# Patient Record
Sex: Female | Born: 2000 | Hispanic: No | Marital: Single | Smoking: Never smoker
Health system: Southern US, Community
[De-identification: ages and names within clinical notes are randomized; demographics above are authoritative.]

## PROBLEM LIST (undated history)

## (undated) DIAGNOSIS — J302 Other seasonal allergic rhinitis: Secondary | ICD-10-CM

## (undated) DIAGNOSIS — T7840XA Allergy, unspecified, initial encounter: Secondary | ICD-10-CM

## (undated) HISTORY — DX: Allergy, unspecified, initial encounter: T78.40XA

---
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
## 2021-06-06 ENCOUNTER — Encounter: Payer: Self-pay | Admitting: Cardiovascular Disease

## 2021-06-06 ENCOUNTER — Other Ambulatory Visit: Payer: Self-pay

## 2021-06-06 ENCOUNTER — Ambulatory Visit (INDEPENDENT_AMBULATORY_CARE_PROVIDER_SITE_OTHER): Payer: Medicaid Other | Admitting: Cardiovascular Disease

## 2021-06-06 VITALS — BP 114/84 | HR 73 | Ht 60.0 in | Wt 177.4 lb

## 2021-06-06 DIAGNOSIS — I498 Other specified cardiac arrhythmias: Secondary | ICD-10-CM

## 2021-06-06 DIAGNOSIS — E669 Obesity, unspecified: Secondary | ICD-10-CM

## 2021-06-06 NOTE — Progress Notes (Signed)
Cardiology Office Note:   Date:  06/06/2021  NAME:  Sherri Vaughan    MRN: [id number] DOB:  09-07-2001   PCP:  Dot Been, FNP  Cardiologist:  None  Electrophysiologist:  None   Referring MD: Dot Been, FNP   Chief Complaint  Patient presents with   Irregular Heart Beat   History of Present Illness:   Sherri Vaughan is a 20 y.o. female with a hx of vasovagal symptoms who is being seen today for the evaluation of irregular heartbeat at the request of Dot Been, FNP.  She reports has been seen by primary care doctor twice.  Noted to have an irregular heartbeat.  She has been sent for EKGs and they have been normal.  She was seen by her primary care physician recently and did have an irregular heartbeat.  EKG again was normal.  She was sent for evaluation here.  Her EKG demonstrates normal sinus rhythm with sinus arrhythmia.  I suspect this is the etiology for irregularity of her heart rhythm.  She really has no medical problems.  In the past she was seen by a pediatric cardiologist for vasovagal symptoms.  She did not have syncope.  Hydration was recommended.  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  I suspect this is artifact as mentioned in the note by the pediatric cardiologist.  She reports no symptoms.  Denies any chest pain or trouble breathing.  Does not exercise routinely.  BMI 34.  She reports she currently works at the [employer].  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  She reports between school and work she has little time to exercise.  She denies any chest pain or trouble breathing.  No palpitations reported.  No limitations.  Lab work from PCP demonstrates low vitamin D.  This has been replaced.  Her EKG in office demonstrates sinus rhythm heart rate 73 with sinus arrhythmia.  Cardiovascular examination is normal.  She reports she vapes.  I recommended against this.  She does not drink alcohol or use drugs.  No family history of  heart disease.  No symptoms reported today.  Problem List Vasovagal symptoms -dx by peds cards 12/03/2016 @ [facility]  Past Medical History: Past Medical History:  Diagnosis Date   Seasonal allergies     Past Surgical History: History reviewed. No pertinent surgical history.  Current Medications: No outpatient medications have been marked as taking for the 06/06/21 encounter (Office Visit) with Sande Rives, MD.     Allergies:    Patient has no known allergies.   Social History: Social History   Socioeconomic History   Marital status: Single    Spouse name: Not on file   Number of children: Not on file   Years of education: Not on file   Highest education level: Not on file  Occupational History   Occupation: [employer]/Student  Tobacco Use   Smoking status: Never    Passive exposure: Never   Smokeless tobacco: Never  Vaping Use   Vaping Use: Never used  Substance and Sexual Activity   Alcohol use: No   Drug use: No   Sexual activity: Yes    Birth control/protection: None  Other Topics Concern   Not on file  Social History Narrative   Not on file   Social Determinants of Health   Financial Resource Strain: Not on file  Food Insecurity: Not on file  Transportation Needs: Not on file  Physical Activity: Not on file  Stress: Not on file  Social Connections: Not on file     Family History: The patient's family history includes Alzheimer's disease in her maternal grandmother; Diabetes in her paternal grandmother; Hypertension in her father; Kidney Stones in her mother.  ROS:   All other ROS reviewed and negative. Pertinent positives noted in the HPI.     EKGs/Labs/Other Studies Reviewed:   The following studies were personally reviewed by me today:  EKG:  EKG is ordered today.  The ekg ordered today demonstrates normal sinus rhythm heart rate 73, sinus arrhythmia which is normal, and was personally reviewed by me.   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    Otherwise normal cardiac anatomy and function.   Recent Labs: No results found for requested labs within last 8760 hours.   Recent Lipid Panel No results found for: CHOL, TRIG, HDL, CHOLHDL, VLDL, LDLCALC, LDLDIRECT  Physical Exam:   VS:  BP 114/84 (BP Location: Left Arm, Patient Position: Sitting, Cuff Size: Small)   Pulse 73   Ht 5' (1.524 m)   Wt 177 lb 6.4 oz (80.5 kg)   SpO2 98%   BMI 34.65 kg/m    Wt Readings from Last 3 Encounters:  06/06/21 177 lb 6.4 oz (80.5 kg) (94 %, Z= 1.56)*  09/26/20 162 lb 3.2 oz (73.6 kg) (90 %, Z= 1.27)*  09/19/20 164 lb (74.4 kg) (91 %, Z= 1.31)*   * Growth percentiles are based on CDC (Girls, 2-20 Years) data.    General: Well nourished, well developed, in no acute distress Head: Atraumatic, normal size  Eyes: PEERLA, EOMI  Neck: Supple, no JVD Endocrine: No thryomegaly Cardiac: Normal S1, S2; RRR; no murmurs, rubs, or gallops Lungs: Clear to auscultation bilaterally, no wheezing, rhonchi or rales  Abd: Soft, nontender, no hepatomegaly  Ext: No edema, pulses 2+ Musculoskeletal: No deformities, BUE and BLE strength normal and equal Skin: Warm and dry, no rashes   Neuro: Alert and oriented to person, place, time, and situation, CNII-XII grossly intact, no focal deficits  Psych: Normal mood and affect   ASSESSMENT:   Sherri Vaughan is a 20 y.o. female who presents for the following: 1. Sinus arrhythmia   2. Obesity (BMI 30-39.9)     PLAN:   1. Sinus arrhythmia -Seen by primary care physician with irregular heartbeat.  EKG there was normal sinus rhythm.  EKG today demonstrates sinus rhythm heart rate 73 with sinus arrhythmia.  She has no symptoms.  No chest pain or trouble breathing.  An echocardiogram was performed as part of pediatric cardiology work-up in 2018.  This was normal.  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   This is a benign entity.  There is mention that this could have been artifact as well.  She has no need for antibiotics or SBE prophylaxis.  I suspect this was all artifact.  She has no murmur on exam.  Even if she did have this this is not an issue.  Her irregular heartbeat is likely just normal related to sinus arrhythmia.  This is related to inspiration and expiration.  This is not an issue.  Given her lack of symptoms I see no need for further work-up.  She needs yearly evaluation by PCP including thyroid studies.  We will defer this to her primary care physician.  2. Obesity (BMI 30-39.9) -Exercise and weight loss recommended.  Disposition: Return if symptoms worsen or fail to improve.  Medication Adjustments/Labs and Tests Ordered: Current medicines are reviewed at length with the patient today.  Concerns regarding medicines are outlined above.  Orders Placed This Encounter  Procedures   EKG 12-Lead    No orders of the defined types were placed in this encounter.   Patient Instructions  Medication Instructions:  The current medical regimen is effective;  continue present plan and medications.  *If you need a refill on your cardiac medications before your next appointment, please call your pharmacy*   Follow-Up: At [facility], you and your health needs are our priority.  As part of our continuing mission to provide you with exceptional heart care, we have created designated Provider Care Teams.  These Care Teams include your primary Cardiologist (physician) and Advanced Practice Providers (APPs -  Physician Assistants and Nurse Practitioners) who all work together to provide you with the care you need, when you need it.  We recommend signing up for the patient portal called "MyChart".  Sign up information is provided on this After Visit Summary.  MyChart is used to connect with patients for Virtual Visits (Telemedicine).  Patients are able to view lab/test results, encounter  notes, upcoming appointments, etc.  Non-urgent messages can be sent to your provider as well.   To learn more about what you can do with MyChart, go to [email or web address].    Your next appointment:   As needed  The format for your next appointment:   In Person  Provider:   Lennie Odor, MD        Signed, Lenna Gilford. Flora Lipps, MD, [facility]  [facility]  [address] [place], [place] [address] [phone]  06/06/2021 3:27 PM

## 2021-06-06 NOTE — Patient Instructions (Signed)
Medication Instructions:  The current medical regimen is effective;  continue present plan and medications.  *If you need a refill on your cardiac medications before your next appointment, please call your pharmacy*    Follow-Up: At [facility], you and your health needs are our priority.  As part of our continuing mission to provide you with exceptional heart care, we have created designated Provider Care Teams.  These Care Teams include your primary Cardiologist (physician) and Advanced Practice Providers (APPs -  Physician Assistants and Nurse Practitioners) who all work together to provide you with the care you need, when you need it.  We recommend signing up for the patient portal called "MyChart".  Sign up information is provided on this After Visit Summary.  MyChart is used to connect with patients for Virtual Visits (Telemedicine).  Patients are able to view lab/test results, encounter notes, upcoming appointments, etc.  Non-urgent messages can be sent to your provider as well.   To learn more about what you can do with MyChart, go to [email or web address].    Your next appointment:   As needed  The format for your next appointment:   In Person  Provider:   [place] O'Neal, MD      

## 2022-01-14 ENCOUNTER — Ambulatory Visit: Payer: Self-pay | Admitting: *Deleted

## 2022-01-14 NOTE — Telephone Encounter (Signed)
Summary: Allergies, symptoms  ? Pt is requesting allergy medication because she says she is experiencing congestion/sneezing/headaches/runny nose.  ? ?Not established yet, but seeking assistance now  ? ?Best contact: [phone]   ?  ? ? ? ?Chief Complaint: requesting medication  ?Symptoms: nasal congestion, cough, headache, sneezing, runny nose.. Yellow mucus Reports yearly allergies. Has not tested for covid.  ?Frequency: 2-3 weeks  ?Pertinent Negatives: Patient denies difficulty breathing , no fever, no covid ?Disposition: [] ED /[] Urgent Care (no appt availability in office) / [] Appointment(In office/virtual)/ []  [facility]/ [] Home Care/ [] Refused Recommended Disposition /[x] [facility]/PCE with , PA []  Follow-up with PCP ?Additional Notes:  ? ?Appt scheduled with PCE walk in with , PA   ? ? ? ?Reason for Disposition ? [1] Sinus congestion (pressure, fullness) AND [2] present > 10 days ? ?Answer Assessment - Initial Assessment Questions ?1. LOCATION: "Where does it hurt?"  ?    Sinuses forehead  ?2. ONSET: "When did the sinus pain start?"  (e.g., hours, days)  ?    2-3 weeks ago ?3. SEVERITY: "How bad is the pain?"   (Scale 1-10; mild, moderate or severe) ?  - MILD (1-3): doesn't interfere with normal activities  ?  - MODERATE (4-7): interferes with normal activities (e.g., work or school) or awakens from sleep ?  - SEVERE (8-10): excruciating pain and patient unable to do any normal activities    ?    Getting worse  ?4. RECURRENT SYMPTOM: "Have you ever had sinus problems before?" If Yes, ask: "When was the last time?" and "What happened that time?"  ?    Yes every year  ?5. NASAL CONGESTION: "Is the nose blocked?" If Yes, ask: "Can you open it or must you breathe through your mouth?" ?    Can breathe nasal congestion noted  ?6. NASAL DISCHARGE: "Do you have discharge from your nose?" If so ask, "What color?" ?    Yes clear  ?7. FEVER: "Do you have a fever?" If  Yes, ask: "What is it, how was it measured, and when did it start?"  ?    no ?8. OTHER SYMPTOMS: "Do you have any other symptoms?" (e.g., sore throat, cough, earache, difficulty breathing) ?    Sneezing, headache , runny nose, nasal and cough congestion ?9. PREGNANCY: "Is there any chance you are pregnant?" "When was your last menstrual period?" ?    na ? ?Protocols used: Sinus Pain or Congestion-A-AH ? ?

## 2022-01-15 ENCOUNTER — Encounter: Payer: Self-pay | Admitting: Physician Assistant

## 2022-01-15 ENCOUNTER — Telehealth (INDEPENDENT_AMBULATORY_CARE_PROVIDER_SITE_OTHER): Payer: Medicaid Other | Admitting: Physician Assistant

## 2022-01-15 DIAGNOSIS — J302 Other seasonal allergic rhinitis: Secondary | ICD-10-CM

## 2022-01-15 MED ORDER — CETIRIZINE HCL 10 MG PO TABS: 10.0000 mg | ORAL_TABLET | Freq: Every day | ORAL | 11 refills | Status: DC

## 2022-01-15 MED ORDER — FLUTICASONE PROPIONATE 50 MCG/ACT NA SUSP: 2.0000 | Freq: Every day | NASAL | 6 refills | Status: DC

## 2022-01-15 NOTE — Progress Notes (Signed)
?Virtual Visit Consent  ? ?Sherri Vaughan, you are scheduled for a virtual visit with a [facility] provider today.   ?  ?Just as with appointments in the office, your consent must be obtained to participate.  Your consent will be active for this visit and any virtual visit you may have with one of our providers in the next 365 days.   ?  ?If you have a MyChart account, a copy of this consent can be sent to you electronically.  All virtual visits are billed to your insurance company just like a traditional visit in the office.   ? ?As this is a virtual visit, video technology does not allow for your provider to perform a traditional examination.  This may limit your provider's ability to fully assess your condition.  If your provider identifies any concerns that need to be evaluated in person or the need to arrange testing (such as labs, EKG, etc.), we will make arrangements to do so.   ?  ?Although advances in technology are sophisticated, we cannot ensure that it will always work on either your end or our end.  If the connection with a video visit is poor, the visit may have to be switched to a telephone visit.  With either a video or telephone visit, we are not always able to ensure that we have a secure connection.    ? ?I need to obtain your verbal consent now.   Are you willing to proceed with your visit today?  ?  ?Alvetta Hidrogo has provided verbal consent on 01/15/2022 for a virtual visit (video or telephone). ?  ?Roney Jaffe, PA-C  ? ?Date: 01/15/2022 1:21 PM ? ? ?Virtual Visit via Video Note  ? ?I, Kasandra Knudsen Mayers, connected with  Rafaela Dinius  ([id number], Mar 27, 2001) on 01/15/22 at  1:40 PM EDT by a video-enabled telemedicine application and verified that I am speaking with the correct person using two identifiers. ? ?Location: ?Patient: Virtual Visit Location Patient: Home ?Provider: Virtual Visit Location Provider: Office/Clinic ?  ?I discussed the limitations of evaluation  and management by telemedicine and the availability of in person appointments. The patient expressed understanding and agreed to proceed.   ? ?History of Present Illness: ?Jakeline Dave is a 21 y.o. who identifies as a female who was assigned female at birth, states that she has been experiencing nasal congestion with yellow mucus, productive cough with yellow sputum, headache, sneezing and runny nose for the past 2 to 3 weeks. ? ?No sick contacts, Did not take COVID test.  Does have a histroy of seasonal allergies ? ? States that she purchased some allergy relief OTC but it makes her drowsy. ? ?Review of Systems  ?Constitutional:  Negative for chills and fever.  ?HENT:  Positive for congestion. Negative for ear pain, sinus pain and sore throat.   ?Eyes: Negative.   ?Respiratory:  Positive for cough. Negative for shortness of breath and wheezing.   ?Cardiovascular:  Negative for chest pain.  ?Gastrointestinal:  Negative for diarrhea, nausea and vomiting.  ?Genitourinary: Negative.   ?Musculoskeletal:  Negative for myalgias.  ?Skin: Negative.   ?Neurological:  Positive for headaches.  ?Endo/Heme/Allergies: Negative.   ?Psychiatric/Behavioral: Negative.    ? ? ? ?HPI: HPI  ?Problems:  ?Patient Active Problem List  ? Diagnosis Date Noted  ? Seasonal allergies 01/15/2022  ? 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  Vasovagal syncope 12/03/2016  ?  ?Allergies: No Known Allergies ?Medications:  ?Current Outpatient Medications:  ?  cetirizine (ZYRTEC) 10 MG tablet, Take 1 tablet (10 mg total) by mouth daily., Disp: 30 tablet, Rfl: 11 ?  fluticasone (FLONASE) 50 MCG/ACT nasal spray, Place 2 sprays into both nostrils daily., Disp: 16 g, Rfl: 6 ?  gabapentin (NEURONTIN) 100 MG capsule, Take 1 capsule (100 mg total) by mouth 3 (three) times daily., Disp: 90 capsule, Rfl: 1 ?  meloxicam (MOBIC) 15 MG tablet, Take 0.5-1 tablets (7.5-15  mg total) by mouth daily as needed for pain., Disp: 30 tablet, Rfl: 6 ?  norethindrone-ethinyl estradiol-iron (LOESTRIN FE) 1.5-30 MG-MCG tablet, Take 1 tablet by mouth daily., Disp: 1 Package, Rfl: 11 ?  norgestimate-ethinyl estradiol (SPRINTEC 28) 0.25-35 MG-MCG tablet, Take 1 tablet by mouth daily. (Patient not taking: Reported on 06/06/2021), Disp: 28 tablet, Rfl: 11 ? ?Observations/Objective: ?Patient is well-developed, well-nourished in no acute distress.  ?Resting comfortably  at home.  ?Head is normocephalic, atraumatic.  ?No labored breathing.  ?Speech is clear and coherent with logical content.  ?Patient is alert and oriented at baseline.  ? ? ?Assessment and Plan: ?1. Seasonal allergies ?- cetirizine (ZYRTEC) 10 MG tablet; Take 1 tablet (10 mg total) by mouth daily.  Dispense: 30 tablet; Refill: 11 ?- fluticasone (FLONASE) 50 MCG/ACT nasal spray; Place 2 sprays into both nostrils daily.  Dispense: 16 g; Refill: 6 ? ?1. Seasonal allergies ?Trial Zyrtec, Flonase.  Patient education given on supportive care.  [facility] ?- cetirizine (ZYRTEC) 10 MG tablet; Take 1 tablet (10 mg total) by mouth daily.  Dispense: 30 tablet; Refill: 11 ?- fluticasone (FLONASE) 50 MCG/ACT nasal spray; Place 2 sprays into both nostrils daily.  Dispense: 16 g; Refill: 6 ? ? ?Follow Up Instructions: ?I discussed the assessment and treatment plan with the patient. The patient was provided an opportunity to ask questions and all were answered. The patient agreed with the plan and demonstrated an understanding of the instructions.  A copy of instructions were sent to the patient via MyChart unless otherwise noted below.  ? ? ? ?The patient was advised to call back or seek an in-person evaluation if the symptoms worsen or if the condition fails to improve as anticipated. ? ?Time:  ?I spent 12 minutes with the patient via telehealth technology discussing the  above problems/concerns.   ? ?Abigale Dorow S Mayers, PA-C ? ?

## 2022-01-15 NOTE — Patient Instructions (Signed)
Sherri Vaughan, thank you for joining Roney Jaffeari S Mayers, PA-C for today's virtual visit.  While this provider is not your primary care provider (PCP), if your PCP is located in our provider database this encounter information will be shared with them immediately following your visit. ? ?Consent: ?(Patient) Sherri Vaughan provided verbal consent for this virtual visit at the beginning of the encounter. ? ?Current Medications: ? ?Current Outpatient Medications:  ?  cetirizine (ZYRTEC) 10 MG tablet, Take 1 tablet (10 mg total) by mouth daily., Disp: 30 tablet, Rfl: 11 ?  fluticasone (FLONASE) 50 MCG/ACT nasal spray, Place 2 sprays into both nostrils daily., Disp: 16 g, Rfl: 6 ?  gabapentin (NEURONTIN) 100 MG capsule, Take 1 capsule (100 mg total) by mouth 3 (three) times daily., Disp: 90 capsule, Rfl: 1 ?  meloxicam (MOBIC) 15 MG tablet, Take 0.5-1 tablets (7.5-15 mg total) by mouth daily as needed for pain., Disp: 30 tablet, Rfl: 6 ?  norethindrone-ethinyl estradiol-iron (LOESTRIN FE) 1.5-30 MG-MCG tablet, Take 1 tablet by mouth daily., Disp: 1 Package, Rfl: 11 ?  norgestimate-ethinyl estradiol (SPRINTEC 28) 0.25-35 MG-MCG tablet, Take 1 tablet by mouth daily. (Patient not taking: Reported on 06/06/2021), Disp: 28 tablet, Rfl: 11  ? ?Medications ordered in this encounter:  ?Meds ordered this encounter  ?Medications  ? cetirizine (ZYRTEC) 10 MG tablet  ?  Sig: Take 1 tablet (10 mg total) by mouth daily.  ?  Dispense:  30 tablet  ?  Refill:  11  ?  Order Specific Question:   Supervising Provider  ?  Answer:   Storm FriskWRIGHT, PATRICK E [[id number]]  ? fluticasone (FLONASE) 50 MCG/ACT nasal spray  ?  Sig: Place 2 sprays into both nostrils daily.  ?  Dispense:  16 g  ?  Refill:  6  ?  Order Specific Question:   Supervising Provider  ?  Answer:   Storm FriskWRIGHT, PATRICK E [[id number]]  ?  ? ?*If you need refills on other medications prior to your next appointment, please contact your pharmacy* ? ?Follow-Up: ?Call back or seek an  in-person evaluation if the symptoms worsen or if the condition fails to improve as anticipated. ? ?Other Instructions ?I hope that you feel better soon, please let us know if there is anything else we can do for you ? ? ?IAllergies, Adult ?An allergy is a condition in which the body's defense system (immune system) comes in contact with an allergen and reacts to it. An allergen is anything that causes an allergic reaction. Allergens cause the immune system to make proteins for fighting infections (antibodies). These antibodies cause cells to release chemicals called histamines that set off the symptoms of an allergic reaction. ?Allergies often affect the nasal passages (allergic rhinitis), eyes (allergic conjunctivitis), skin (atopic dermatitis), and stomach. Allergies can be mild, moderate, or severe. They cannot spread from person to person. Allergies can develop at any age and may be outgrown. ?What are the causes? ?This condition is caused by allergens. Common allergens include: ?Outdoor allergens, such as pollen, car fumes, and mold. ?Indoor allergens, such as dust, smoke, mold, and pet dander. ?Other allergens, such as foods, medicines, scents, insect bites or stings, and other skin irritants. ?What increases the risk? ?You are more likely to develop this condition if you have: ?Family members with allergies. ?Family members who have any condition that may be caused by allergens, such as asthma. This may make you more likely to have other allergies. ?What are the signs or symptoms? ?Symptoms  of this condition depend on the severity of the allergy. ?Mild to moderate symptoms ?Runny nose, stuffy nose (nasal congestion), or sneezing. ?Itchy mouth, ears, or throat. ?A feeling of mucus dripping down the back of your throat (postnasal drip). ?Sore throat. ?Itchy, red, watery, or puffy eyes. ?Skin rash, or itchy, red, swollen areas of skin (hives). ?Stomach cramps or bloating. ?Severe symptoms ?Severe allergies to  food, medicine, or insect bites may cause anaphylaxis, which can be life-threatening. Symptoms include: ?A red (flushed) face. ?Wheezing or coughing. ?Swollen lips, tongue, or mouth. ?Tight or swollen throat. ?Chest pain or tightness, or rapid heartbeat. ?Trouble breathing or shortness of breath. ?Pain in the abdomen, vomiting, or diarrhea. ?Dizziness or fainting. ?How is this diagnosed? ?This condition is diagnosed based on your symptoms, your family and medical history, and a physical exam. You may also have tests, including: ?Skin tests to see how your skin reacts to allergens that may be causing your symptoms. Tests include: ?Skin prick test. For this test, an allergen is introduced to your body through a small opening in the skin. ?Intradermal skin test. For this test, a small amount of allergen is injected under the first layer of your skin. ?Patch test. For this test, a small amount of allergen is placed on your skin. The area is covered and then checked after a few days. ?Blood tests. ?A challenge test. For this test, you will eat or breathe in a small amount of allergen to see if you have an allergic reaction. ?You may also be asked to: ?Keep a food diary. This is a record of all the foods, drinks, and symptoms you have in a day. ?Try an elimination diet. To do this: ?Remove certain foods from your diet. ?Add those foods back one by one to find out if any foods cause an allergic reaction. ?How is this treated? ?  ?Treatment for allergies depends on your symptoms. Treatment may include: ?Cold, wet cloths (cold compresses) to soothe itching and swelling. ?Eye drops or nasal sprays. ?Nasal irrigation to help clear your mucus or keep the nasal passages moist. ?A humidifier to add moisture to the air. ?Skin creams to treat rashes or itching. ?Oral antihistamines or other medicines to block the reaction or to treat inflammation. ?Diet changes to remove foods that cause allergies. ?Being exposed again and again to  tiny amounts of allergens to help you build a defense against it (tolerance). This is called immunotherapy. Examples include: ?Allergy shot. You receive an injection that contains an allergen. ?Sublingual immunotherapy. You take a small dose of allergen under your tongue. ?Emergency injection for anaphylaxis. You give yourself a shot using a syringe (auto-injector) that contains the amount of medicine you need. Your health care provider will teach you how to give yourself an injection. ?Follow these instructions at home: ?Medicines ? ?Take or apply over-the-counter and prescription medicines only as told by your health care provider. ?Always carry your auto-injector pen if you are at risk of anaphylaxis. Give yourself an injection as told by your health care provider. ?Eating and drinking ?Follow instructions from your health care provider about eating or drinking restrictions. ?Drink enough fluid to keep your urine pale yellow. ?General instructions ?Wear a medical alert bracelet or necklace to let others know that you have had anaphylaxis before. ?Avoid known allergens whenever possible. ?Keep all follow-up visits as told by your health care provider. This is important. ?Contact a health care provider if: ?Your symptoms do not get better with treatment. ?Get help  right away if: ?You have symptoms of anaphylaxis. These include: ?Swollen mouth, tongue, or throat. ?Pain or tightness in your chest. ?Trouble breathing or shortness of breath. ?Dizziness or fainting. ?Severe abdominal pain, vomiting, or diarrhea. ?These symptoms may represent a serious problem that is an emergency. Do not wait to see if the symptoms will go away. Get medical help right away. Call your local emergency services (911 in the [place] Do not drive yourself to the hospital. ?Summary ?Take or apply over-the-counter and prescription medicines only as told by your health care provider. ?Avoid known allergens when possible. ?Always carry your  auto-injector pen if you are at risk of anaphylaxis. Give yourself an injection as told by your health care provider. ?Wear a medical alert bracelet or necklace to let others know that you have had anaphylaxis b

## 2022-01-20 ENCOUNTER — Telehealth: Payer: Medicaid Other

## 2022-01-20 ENCOUNTER — Ambulatory Visit: Payer: Self-pay | Admitting: *Deleted

## 2022-01-20 NOTE — Telephone Encounter (Signed)
Per agent: ?"Pt called in wanting to be seen at [facility] as soon as possible, pt also made new pt appt at [facility], pt stated she thinks she has an ear infection / ear ache and needs advice, no same day appts available." ? ? ?Chief Complaint: earache left ear ?Symptoms: earache ?Frequency: yesterday ?Pertinent Negatives: Patient denies any cold symptoms ?Disposition: [] ED /[] Urgent Care (no appt availability in office) / [] Appointment(In office/virtual)/ [x]  [facility]/ [] Home Care/ [] Refused Recommended Disposition /[] [facility]/ []  Follow-up with PCP ?Additional Notes: No PCP. Virtual appt secured for today ? ?Reason for Disposition ? Earache  (Exceptions: brief ear pain of < 60 minutes duration, earache occurring during air travel ? ?Answer Assessment - Initial Assessment Questions ?1. LOCATION: "Which ear is involved?" ?    Left ear ?2. ONSET: "When did the ear start hurting"  ?    yesterday ?3. SEVERITY: "How bad is the pain?"  (Scale 1-10; mild, moderate or severe) ?  - MILD (1-3): doesn't interfere with normal activities  ?  - MODERATE (4-7): interferes with normal activities or awakens from sleep  ?  - SEVERE (8-10): excruciating pain, unable to do any normal activities  ?    5/10 ?4. URI SYMPTOMS: "Do you have a runny nose or cough?" ?    No ?5. FEVER: "Do you have a fever?" If Yes, ask: "What is your temperature, how was it measured, and when did it start?" ?    no ?6. CAUSE: "Have you been swimming recently?", "How often do you use Q-TIPS?", "Have you had any recent air travel or scuba diving?" ?    no ?7. OTHER SYMPTOMS: "Do you have any other symptoms?" (e.g., headache, stiff neck, dizziness, vomiting, runny nose, decreased hearing) ?    "Feels blocked" ? ?Protocols used: Earache-A-AH ? ?

## 2022-02-28 NOTE — Progress Notes (Signed)
? ?New Patient Office Visit ? ?Subjective   ? ?Patient ID: Sherri Vaughan, female    DOB: 04/04/2001  Age: 21 y.o. MRN: [id number] ? ?CC:  ?Chief Complaint  ?Patient presents with  ? New Patient (Initial Visit)  ?HPI ?Sherri Vaughan presents to establish care ? ?The patient struggled with spring allergies this season and saw our physician assistant Mayers end of March.  At that visit the patient received cetirizine and Flonase.  Despite this she still has eye itching and nasal drainage and scratchy throat.  Patient also has occasional headache. ? ?The other issue is that she is sexually active with multiple partners.  Her partners use condoms.  The patient does not wish to become pregnant.  See PI SQ assessment. ? ?Patient has tried birth control pills but she is forgetful with these medicines.  She tried a Nexplanon and this caused side effects.  She has not consider Depo-Provera and is yet to consider the patch. ? ?Patient would like to give further consideration to either injectable or patch ? ?The patient has never been pregnant before. ? ?Patient does need sexual transmitted disease screening at this visit. ? ?Patient has no other complaints. ?Outpatient Encounter Medications as of 03/02/2022  ?Medication Sig  ? azelastine (ASTELIN) 0.1 % nasal spray Place 2 sprays into both nostrils 2 (two) times daily. Use in each nostril as directed  ? cetirizine (ZYRTEC) 10 MG tablet Take 1 tablet (10 mg total) by mouth daily.  ? fluticasone (FLONASE) 50 MCG/ACT nasal spray Place 2 sprays into both nostrils daily.  ? olopatadine (PATANOL) 0.1 % ophthalmic solution Place 1 drop into both eyes 2 (two) times daily.  ? [DISCONTINUED] gabapentin (NEURONTIN) 100 MG capsule Take 1 capsule (100 mg total) by mouth 3 (three) times daily.  ? [DISCONTINUED] meloxicam (MOBIC) 15 MG tablet Take 0.5-1 tablets (7.5-15 mg total) by mouth daily as needed for pain.  ? [DISCONTINUED] norethindrone-ethinyl estradiol-iron  (LOESTRIN FE) 1.5-30 MG-MCG tablet Take 1 tablet by mouth daily.  ? [DISCONTINUED] norgestimate-ethinyl estradiol (SPRINTEC 28) 0.25-35 MG-MCG tablet Take 1 tablet by mouth daily. (Patient not taking: Reported on 06/06/2021)  ? ?No facility-administered encounter medications on file as of 03/02/2022.  ? ? ?Past Medical History:  ?Diagnosis Date  ? Allergy   ? Seasonal allergies   ? ? ?History reviewed. No pertinent surgical history. ? ?Family History  ?Problem Relation Age of Onset  ? Kidney Stones Mother   ? Hypertension Father   ? Alzheimer's disease Maternal Grandmother   ? Diabetes Paternal Grandmother   ? ? ?Social History  ? ?Socioeconomic History  ? Marital status: Single  ?  Spouse name: Not on file  ? Number of children: Not on file  ? Years of education: Not on file  ? Highest education level: Not on file  ?Occupational History  ? Occupation: [employer]Student  ?Tobacco Use  ? Smoking status: Never  ?  Passive exposure: Never  ? Smokeless tobacco: Never  ?Vaping Use  ? Vaping Use: Some days  ?Substance and Sexual Activity  ? Alcohol use: Yes  ? Drug use: No  ? Sexual activity: Yes  ?  Birth control/protection: None  ?Other Topics Concern  ? Not on file  ?Social History Narrative  ? Not on file  ? ?Social Determinants of Health  ? ?Financial Resource Strain: Not on file  ?Food Insecurity: Not on file  ?Transportation Needs: Not on file  ?Physical Activity: Not on file  ?Stress:  Not on file  ?Social Connections: Not on file  ?Intimate Partner Violence: Not on file  ? ? ?Review of Systems  ?Constitutional:  Negative for chills, diaphoresis, fever, malaise/fatigue and weight loss.  ?HENT:  Positive for congestion and sinus pain. Negative for ear discharge, ear pain, hearing loss, nosebleeds, sore throat and tinnitus.   ?Eyes:  Positive for discharge and redness. Negative for blurred vision and photophobia.  ?Respiratory:  Positive for cough. Negative for hemoptysis, sputum production, shortness of  breath, wheezing and stridor.   ?Cardiovascular:  Negative for chest pain, palpitations, orthopnea, claudication, leg swelling and PND.  ?Gastrointestinal:  Negative for abdominal pain, blood in stool, constipation, diarrhea, heartburn, nausea and vomiting.  ?Genitourinary:  Negative for dysuria, flank pain, frequency, hematuria and urgency.  ?Musculoskeletal:  Negative for back pain, falls, joint pain, myalgias and neck pain.  ?Skin:  Negative for itching and rash.  ?Neurological:  Positive for headaches. Negative for dizziness, tingling, tremors, sensory change, speech change, focal weakness, seizures, loss of consciousness and weakness.  ?Endo/Heme/Allergies:  Positive for environmental allergies. Negative for polydipsia. Does not bruise/bleed easily.  ?Psychiatric/Behavioral:  Negative for depression, hallucinations, memory loss, substance abuse and suicidal ideas. The patient is not nervous/anxious and does not have insomnia.   ? ?  ? ? ?Objective   ? ?BP 137/84   Pulse (!) 101   Ht 5' (1.524 m)   Wt 177 lb 6.4 oz (80.5 kg)   SpO2 98%   BMI 34.65 kg/m?  ? ?Physical Exam ?Vitals reviewed.  ?Constitutional:   ?   Appearance: Normal appearance. She is well-developed. She is obese. She is not diaphoretic.  ?HENT:  ?   Head: Normocephalic and atraumatic.  ?   Nose: No nasal deformity, septal deviation, mucosal edema or rhinorrhea.  ?   Right Sinus: No maxillary sinus tenderness or frontal sinus tenderness.  ?   Left Sinus: No maxillary sinus tenderness or frontal sinus tenderness.  ?   Mouth/Throat:  ?   Pharynx: No oropharyngeal exudate.  ?Eyes:  ?   General: No scleral icterus. ?   Conjunctiva/sclera: Conjunctivae normal.  ?   Pupils: Pupils are equal, round, and reactive to light.  ?Neck:  ?   Thyroid: No thyromegaly.  ?   Vascular: No carotid bruit or JVD.  ?   Trachea: Trachea normal. No tracheal tenderness or tracheal deviation.  ?Cardiovascular:  ?   Rate and Rhythm: Normal rate and regular rhythm.  ?    Chest Wall: PMI is not displaced.  ?   Pulses: Normal pulses. No decreased pulses.  ?   Heart sounds: Normal heart sounds, S1 normal and S2 normal. Heart sounds not distant. No murmur heard. ?No systolic murmur is present.  ?No diastolic murmur is present.  ?  No friction rub. No gallop. No S3 or S4 sounds.  ?Pulmonary:  ?   Effort: No tachypnea, accessory muscle usage or respiratory distress.  ?   Breath sounds: No stridor. No decreased breath sounds, wheezing, rhonchi or rales.  ?Chest:  ?   Chest wall: No tenderness.  ?Abdominal:  ?   General: Bowel sounds are normal. There is no distension.  ?   Palpations: Abdomen is soft. Abdomen is not rigid.  ?   Tenderness: There is no abdominal tenderness. There is no guarding or rebound.  ?Musculoskeletal:     ?   General: Normal range of motion.  ?   Cervical back: Normal range of motion and neck supple.  No edema, erythema or rigidity. No muscular tenderness. Normal range of motion.  ?Lymphadenopathy:  ?   Head:  ?   Right side of head: No submental or submandibular adenopathy.  ?   Left side of head: No submental or submandibular adenopathy.  ?   Cervical: No cervical adenopathy.  ?Skin: ?   General: Skin is warm and dry.  ?   Coloration: Skin is not pale.  ?   Findings: No rash.  ?   Nails: There is no clubbing.  ?Neurological:  ?   Mental Status: She is alert and oriented to person, place, and time.  ?   Sensory: No sensory deficit.  ?Psychiatric:     ?   Speech: Speech normal.     ?   Behavior: Behavior normal.  ? ? ? ?  ? ?Assessment & Plan:  ? ?Problem List Items Addressed This Visit   ? ?  ? Other  ? Seasonal allergies - Primary  ?  Seasonal allergies for which the patient is yet to respond to therapy ? ?Patient will begin Pataday eyedrops and Astelin 2 sprays each nostril twice daily.  She will continue Flonase and cetirizine ? ?  ?  ? Screen for STD (sexually transmitted disease)  ?  Plan to check HIV hepatitis C cervical vaginal swab ? ?  ?  ? Relevant Orders   ? HIV Antibody (routine testing w rflx)  ? Cervicovaginal ancillary only  ? HPV vaccine counseling  ?  Patient received HPV vaccine counseling she wishes to hold off for now ? ?  ?  ? General counseling and advice f

## 2022-03-02 ENCOUNTER — Ambulatory Visit: Payer: Medicaid Other | Attending: Family Medicine | Admitting: Critical Care Medicine

## 2022-03-02 ENCOUNTER — Other Ambulatory Visit: Payer: Self-pay

## 2022-03-02 ENCOUNTER — Encounter: Payer: Self-pay | Admitting: Critical Care Medicine

## 2022-03-02 ENCOUNTER — Other Ambulatory Visit (HOSPITAL_COMMUNITY)
Admission: RE | Admit: 2022-03-02 | Discharge: 2022-03-02 | Disposition: A | Discharge status: Home / Self Care | Payer: Medicaid Other | Source: Ambulatory Visit | Attending: Critical Care Medicine | Admitting: Critical Care Medicine

## 2022-03-02 VITALS — BP 137/84 | HR 101 | Ht 60.0 in | Wt 177.4 lb

## 2022-03-02 DIAGNOSIS — Z139 Encounter for screening, unspecified: Secondary | ICD-10-CM

## 2022-03-02 DIAGNOSIS — Z6834 Body mass index (BMI) 34.0-34.9, adult: Secondary | ICD-10-CM | POA: Insufficient documentation

## 2022-03-02 DIAGNOSIS — Z7185 Encounter for immunization safety counseling: Secondary | ICD-10-CM | POA: Insufficient documentation

## 2022-03-02 DIAGNOSIS — Z113 Encounter for screening for infections with a predominantly sexual mode of transmission: Secondary | ICD-10-CM | POA: Diagnosis not present

## 2022-03-02 DIAGNOSIS — Z1159 Encounter for screening for other viral diseases: Secondary | ICD-10-CM

## 2022-03-02 DIAGNOSIS — Z3009 Encounter for other general counseling and advice on contraception: Secondary | ICD-10-CM

## 2022-03-02 DIAGNOSIS — J302 Other seasonal allergic rhinitis: Secondary | ICD-10-CM | POA: Diagnosis not present

## 2022-03-02 MED ORDER — AZELASTINE HCL 0.1 % NA SOLN
2.0000 | Freq: Two times a day (BID) | NASAL | 12 refills | Status: DC
  Filled 2022-03-02: qty 30, 50d supply, fill #0

## 2022-03-02 MED ORDER — OLOPATADINE HCL 0.1 % OP SOLN
1.0000 [drp] | Freq: Two times a day (BID) | OPHTHALMIC | 0 refills | Status: DC
  Filled 2022-03-02: qty 5, 50d supply, fill #0

## 2022-03-02 NOTE — Assessment & Plan Note (Signed)
Plan to check HIV hepatitis C cervical vaginal swab ?

## 2022-03-02 NOTE — Patient Instructions (Signed)
Health screening will be obtained with hemoglobin A1c ? ?Sexual transmitted disease screening will be obtained with cervical vaginal swab, HIV ? ?Hepatitis C assay will be obtained for screening ? ?We discussed an HPV vaccine went away to you confirm the status of your insurance ? ?We discussed contraceptive measures including consideration for the patch or injectable medications ? ?Begin Pataday eyedrops daily and Astelin 2 sprays each nostril twice daily for allergies ? ?Return to see Dr. Delford Field 3 months ? ? ?

## 2022-03-02 NOTE — Assessment & Plan Note (Signed)
Patient received HPV vaccine counseling she wishes to hold off for now ?

## 2022-03-02 NOTE — Assessment & Plan Note (Signed)
Seasonal allergies for which the patient is yet to respond to therapy ? ?Patient will begin Pataday eyedrops and Astelin 2 sprays each nostril twice daily.  She will continue Flonase and cetirizine ?

## 2022-03-02 NOTE — Assessment & Plan Note (Signed)
Patient given counseling regarding diet exercise ?

## 2022-03-02 NOTE — Assessment & Plan Note (Addendum)
I spent 10 minutes going over contraceptive counseling for the patient.  She is trying to decide between either a topical patch or Depo-Provera.  Her big concern is she does not want to lose her cycling of her periods ? ?Patient is given this advisement and will return with her decision next visit 3 months ?

## 2022-03-03 ENCOUNTER — Encounter: Payer: Self-pay | Admitting: Critical Care Medicine

## 2022-03-03 ENCOUNTER — Other Ambulatory Visit: Payer: Self-pay

## 2022-03-03 LAB — CERVICOVAGINAL ANCILLARY ONLY
Chlamydia: NEGATIVE
Comment: NEGATIVE
Comment: NORMAL
Neisseria Gonorrhea: NEGATIVE

## 2022-03-03 LAB — HEMOGLOBIN A1C
Est. average glucose Bld gHb Est-mCnc: 120 mg/dL
Hgb A1c MFr Bld: 5.8 % — ABNORMAL HIGH (ref 4.8–5.6)

## 2022-03-03 LAB — HCV AB W REFLEX TO QUANT PCR: HCV Ab: NONREACTIVE

## 2022-03-03 LAB — HCV INTERPRETATION

## 2022-03-03 LAB — HIV ANTIBODY (ROUTINE TESTING W REFLEX): HIV Screen 4th Generation wRfx: NONREACTIVE

## 2022-04-13 ENCOUNTER — Ambulatory Visit: Payer: Medicaid Other | Admitting: Critical Care Medicine

## 2022-06-03 ENCOUNTER — Ambulatory Visit (HOSPITAL_COMMUNITY)
Admission: EM | Admit: 2022-06-03 | Discharge: 2022-06-03 | Disposition: A | Discharge status: Home / Self Care | Payer: Medicaid Other | Attending: Internal Medicine | Admitting: Internal Medicine

## 2022-06-03 ENCOUNTER — Encounter (HOSPITAL_COMMUNITY): Payer: Self-pay | Admitting: Emergency Medicine

## 2022-06-03 DIAGNOSIS — J039 Acute tonsillitis, unspecified: Secondary | ICD-10-CM | POA: Diagnosis present

## 2022-06-03 LAB — POCT INFECTIOUS MONO SCREEN, ED / UC: Mono Screen: NEGATIVE

## 2022-06-03 LAB — POCT RAPID STREP A, ED / UC: Streptococcus, Group A Screen (Direct): NEGATIVE

## 2022-06-03 MED ORDER — CEFDINIR 300 MG PO CAPS
300.0000 mg | ORAL_CAPSULE | Freq: Two times a day (BID) | ORAL | 0 refills | Status: AC
Start: 2022-06-03 — End: ?

## 2022-06-03 NOTE — Discharge Instructions (Signed)
Your strep and mono were negative.  We are going to treat with an antibiotic.  Take cefdinir twice daily for 10 days.  Alternate Tylenol ibuprofen and gargle with warm salt water.  If you have any worsening symptoms including cough, congestion, swelling of your throat, difficulty speaking, difficulty breathing, change in your voice you need to be seen immediately.  If symptoms are proving within a few days of antibiotics please be seen again.

## 2022-06-03 NOTE — ED Provider Notes (Signed)
[facility]    CSN: [id number] Arrival date & time: 06/03/22  6010      History   Chief Complaint Chief Complaint  Patient presents with   Sore Throat   Fever    HPI Sherri Vaughan is a 21 y.o. female.   Patient presents today with a 24-hour history of sore throat.  Reports associated subjective fever, chills, mild nasal congestion.  Denies any cough, chest pain, shortness of breath, nausea, vomiting.  She has tried Tylenol and ibuprofen for pain relief.  Denies any known sick contacts but was in the hospital visiting her uncle recently before symptoms began.  She has not had COVID-vaccine.  She has not had COVID in the past.  She does have a history of allergies treated with as needed cetirizine.  Has not been taking this regularly recently.  Denies history of asthma, COPD.  She does occasionally vape but has not been doing this recently.  She is confident that she is not pregnant.  She reports sore throat pain is rated 6/7 on a 0-10 pain scale, described as aching, worse with swallowing, no alleviating factors identified.    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 No pertinent surgical history.  OB History     Gravida  0   Para  0   Term  0   Preterm  0   AB  0   Living  0      SAB  0   IAB  0   Ectopic  0   Multiple  0   Live Births  0            Home Medications    Prior to Admission medications   Medication Sig Start Date End Date Taking? Authorizing Provider  cefdinir (OMNICEF) 300 MG capsule Take 1 capsule  (300 mg total) by mouth 2 (two) times daily. 06/03/22  Yes Feleshia Zundel, Noberto Retort, PA-C    Family History Family History  Problem Relation Age of Onset   Kidney Stones Mother    Hypertension Father    Alzheimer's disease Maternal Grandmother    Diabetes Paternal Grandmother     Social History Social History   Tobacco Use   Smoking status: Never    Passive exposure: Never   Smokeless tobacco: Never  Vaping Use   Vaping Use: Some days  Substance Use Topics   Alcohol use: Yes   Drug use: No     Allergies   Patient has no known allergies.   Review of Systems Review of Systems  Constitutional:  Positive for activity change, chills, fatigue and fever. Negative for appetite change.  HENT:  Positive for congestion and sore throat. Negative for sinus pressure, sneezing, trouble swallowing and voice change.   Respiratory:  Negative for cough and shortness of breath.   Cardiovascular:  Negative for chest pain.  Gastrointestinal:  Negative for abdominal pain, diarrhea, nausea and vomiting.  Neurological:  Negative for dizziness, light-headedness and headaches.     Physical Exam Triage Vital Signs ED Triage Vitals  Enc Vitals  Group     BP 06/03/22 1049 135/85     Pulse Rate 06/03/22 1049 100     Resp 06/03/22 1049 18     Temp 06/03/22 1049 98.9 F (37.2 C)     Temp src --      SpO2 06/03/22 1049 98 %     Weight --      Height --      Head Circumference --      Peak Flow --      Pain Score 06/03/22 1047 7     Pain Loc --      Pain Edu? --      Excl. in GC? --    No data found.  Updated Vital Signs BP 135/85 (BP Location: Right Arm)   Pulse 100   Temp 98.9 F (37.2 C)   Resp 18   LMP 05/24/2022   SpO2 98%   Visual Acuity Right Eye Distance:   Left Eye Distance:   Bilateral Distance:    Right Eye Near:   Left Eye Near:    Bilateral Near:     Physical Exam Vitals reviewed.  Constitutional:      General: She is awake. She is not in acute distress.     Appearance: Normal appearance. She is well-developed. She is not ill-appearing.     Comments: Very pleasant female appears stated age in no acute distress sitting comfortably in exam room  HENT:     Head: Normocephalic and atraumatic.     Right Ear: Ear canal and external ear normal. Tympanic membrane is scarred. Tympanic membrane is not erythematous or bulging.     Left Ear: Ear canal and external ear normal. Tympanic membrane is scarred. Tympanic membrane is not erythematous or bulging.     Nose:     Right Sinus: No maxillary sinus tenderness or frontal sinus tenderness.     Left Sinus: No maxillary sinus tenderness or frontal sinus tenderness.     Mouth/Throat:     Pharynx: Uvula midline. Posterior oropharyngeal erythema present. No oropharyngeal exudate.     Tonsils: Tonsillar exudate present. No tonsillar abscesses. 2+ on the right. 2+ on the left.  Cardiovascular:     Rate and Rhythm: Normal rate and regular rhythm.     Heart sounds: Normal heart sounds, S1 normal and S2 normal. No murmur heard. Pulmonary:     Effort: Pulmonary effort is normal.     Breath sounds: Normal breath sounds. No wheezing, rhonchi or rales.     Comments: Clear to auscultation bilaterally Psychiatric:        Behavior: Behavior is cooperative.      UC Treatments / Results  Labs (all labs ordered are listed, but only abnormal results are displayed) Labs Reviewed  CULTURE, GROUP A STREP [facility])  POCT RAPID STREP A, ED / UC  POCT INFECTIOUS MONO SCREEN, ED / UC    EKG   Radiology No results found.  Procedures Procedures (including critical care time)  Medications Ordered in UC Medications - No data to display  Initial Impression / Assessment and Plan / UC Course  I have reviewed the triage vital signs and the nursing notes.  Pertinent labs & imaging results that were available during my care of the patient were reviewed by me and considered in my medical decision making (see chart for  details).     Patient is well-appearing, afebrile, nontoxic.  Strep and mono were negative in clinic today.  Patient has significant exudate  on exam so will cover for exudative tonsillitis.  Throat culture is pending.  We will start Omnicef twice daily for 10 days.  Discussed that if she develops any side effects or concerns she should return for reevaluation.  Viral testing was deferred as she denies any significant URI symptoms.  She was encouraged to use Tylenol and ibuprofen for pain relief and gargle with warm salt water for symptom relief.  If she has any fever, swelling of her throat, dysphagia, odynophagia, muffled voice, nausea, vomiting she needs to be seen immediately to which she expressed understanding.  Strict return precautions given to which she expressed understanding.  Offered work excuse note which she declined.  Final Clinical Impressions(s) / UC Diagnoses   Final diagnoses:  Exudative tonsillitis     Discharge Instructions      Your strep and mono were negative.  We are going to treat with an antibiotic.  Take cefdinir twice daily for 10 days.  Alternate Tylenol ibuprofen and gargle with warm salt water.  If you have any worsening symptoms including cough, congestion, swelling of your throat, difficulty speaking, difficulty breathing, change in your voice you need to be seen immediately.  If symptoms are proving within a few days of antibiotics please be seen again.     ED Prescriptions     Medication Sig Dispense Auth. Provider   cefdinir (OMNICEF) 300 MG capsule Take 1 capsule (300 mg total) by mouth 2 (two) times daily. 20 capsule Deolinda Frid, Noberto Retort, PA-C      PDMP not reviewed this encounter.   Jeani Hawking, PA-C 06/03/22 1218

## 2022-06-03 NOTE — ED Triage Notes (Addendum)
Pt c/o sore throat that feels swollen and fevers, chills since yesterday. Taking tylenol and ibuprofen for fevers/pain.

## 2022-06-04 LAB — CULTURE, GROUP A STREP (THRC)

## 2022-11-11 ENCOUNTER — Encounter: Payer: Self-pay | Admitting: Critical Care Medicine

## 2022-11-12 NOTE — Telephone Encounter (Signed)
Sherri Vaughan  pt needs copy of her immunization records

## 2022-11-13 ENCOUNTER — Telehealth: Payer: Self-pay

## 2022-11-13 NOTE — Telephone Encounter (Signed)
Called patient to let her know that we didn't have her immunization on file and that the health department would be the best option

## 2023-04-29 ENCOUNTER — Telehealth: Payer: Self-pay

## 2023-04-29 NOTE — Telephone Encounter (Signed)
LVM for patient to call back [phone], or to call PCP office to schedule follow up apt. AS, CMA
